# Patient Record
Sex: Male | Born: 1985 | Race: White | Hispanic: No | Marital: Single
Health system: Southern US, Community
[De-identification: ages and names within clinical notes are randomized; demographics above are authoritative.]

---

## 2012-05-29 ENCOUNTER — Ambulatory Visit (INDEPENDENT_AMBULATORY_CARE_PROVIDER_SITE_OTHER): Payer: PRIVATE HEALTH INSURANCE

## 2012-05-29 ENCOUNTER — Other Ambulatory Visit: Payer: Self-pay | Admitting: Unknown Physician Specialty

## 2012-05-29 DIAGNOSIS — R109 Unspecified abdominal pain: Secondary | ICD-10-CM

## 2012-05-29 DIAGNOSIS — R197 Diarrhea, unspecified: Secondary | ICD-10-CM

## 2012-07-01 ENCOUNTER — Ambulatory Visit (HOSPITAL_COMMUNITY): Payer: PRIVATE HEALTH INSURANCE | Admitting: Psychiatry

## 2013-07-02 ENCOUNTER — Telehealth: Payer: Self-pay | Admitting: *Deleted

## 2013-07-03 NOTE — Telephone Encounter (Signed)
Returned [employer] to me: 1. He and Mother Nathan Dougherty would like to make an appt with Dr. Fransico Michael and myself to discuss several issues. 2. They are having a lot of trouble getting Nathan Dougherty to eat. 3. Nathan Dougherty is losing weight and they are concerned. 4. Father is in a meeting and will call me back.

## 2013-07-31 ENCOUNTER — Institutional Professional Consult (permissible substitution): Payer: PRIVATE HEALTH INSURANCE | Admitting: Pediatrics

## 2014-01-12 IMAGING — CR DG ABDOMEN 2V
2 series · 2 of 2 positions shown · non-contrast
Comparison: None.

CLINICAL DATA: Left-sided back and abdomen pain, diarrhea for 5
days, recent history of kidney stone

ABDOMEN - 2 VIEW

[view not recorded (1 of 2)]
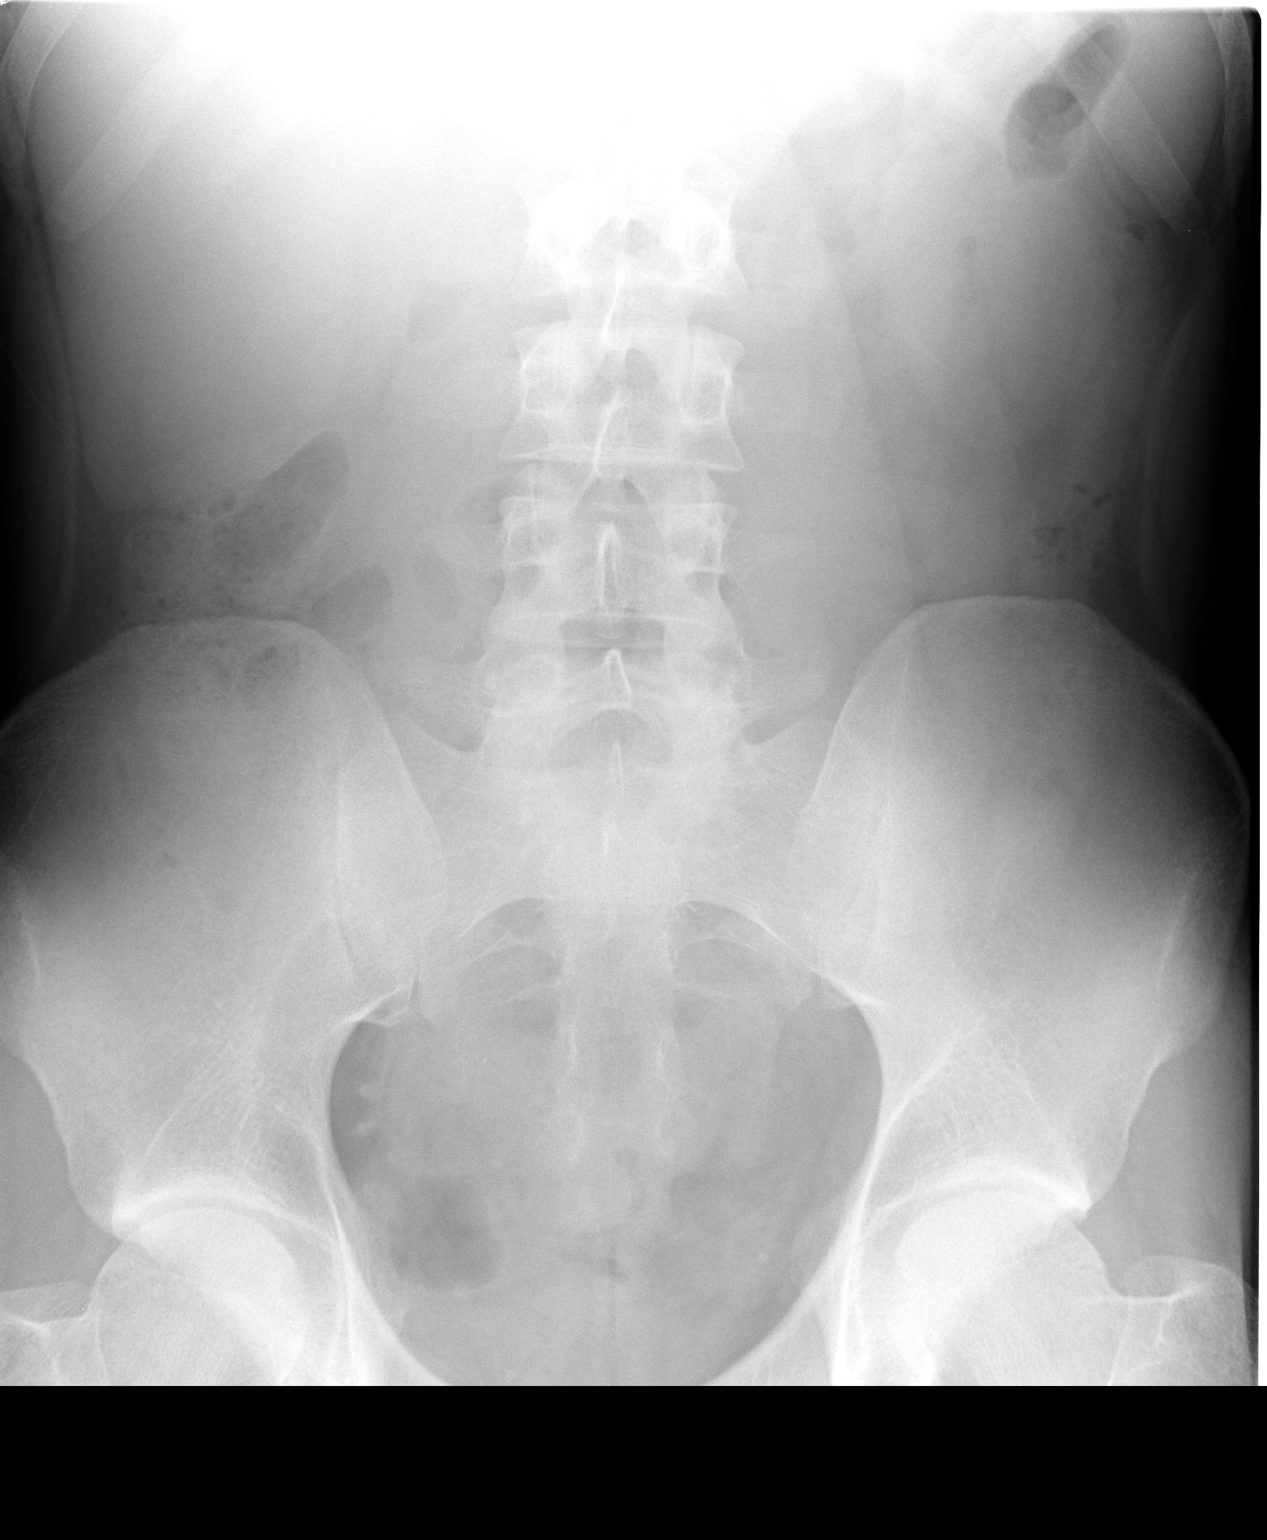

[view not recorded (2 of 2)]
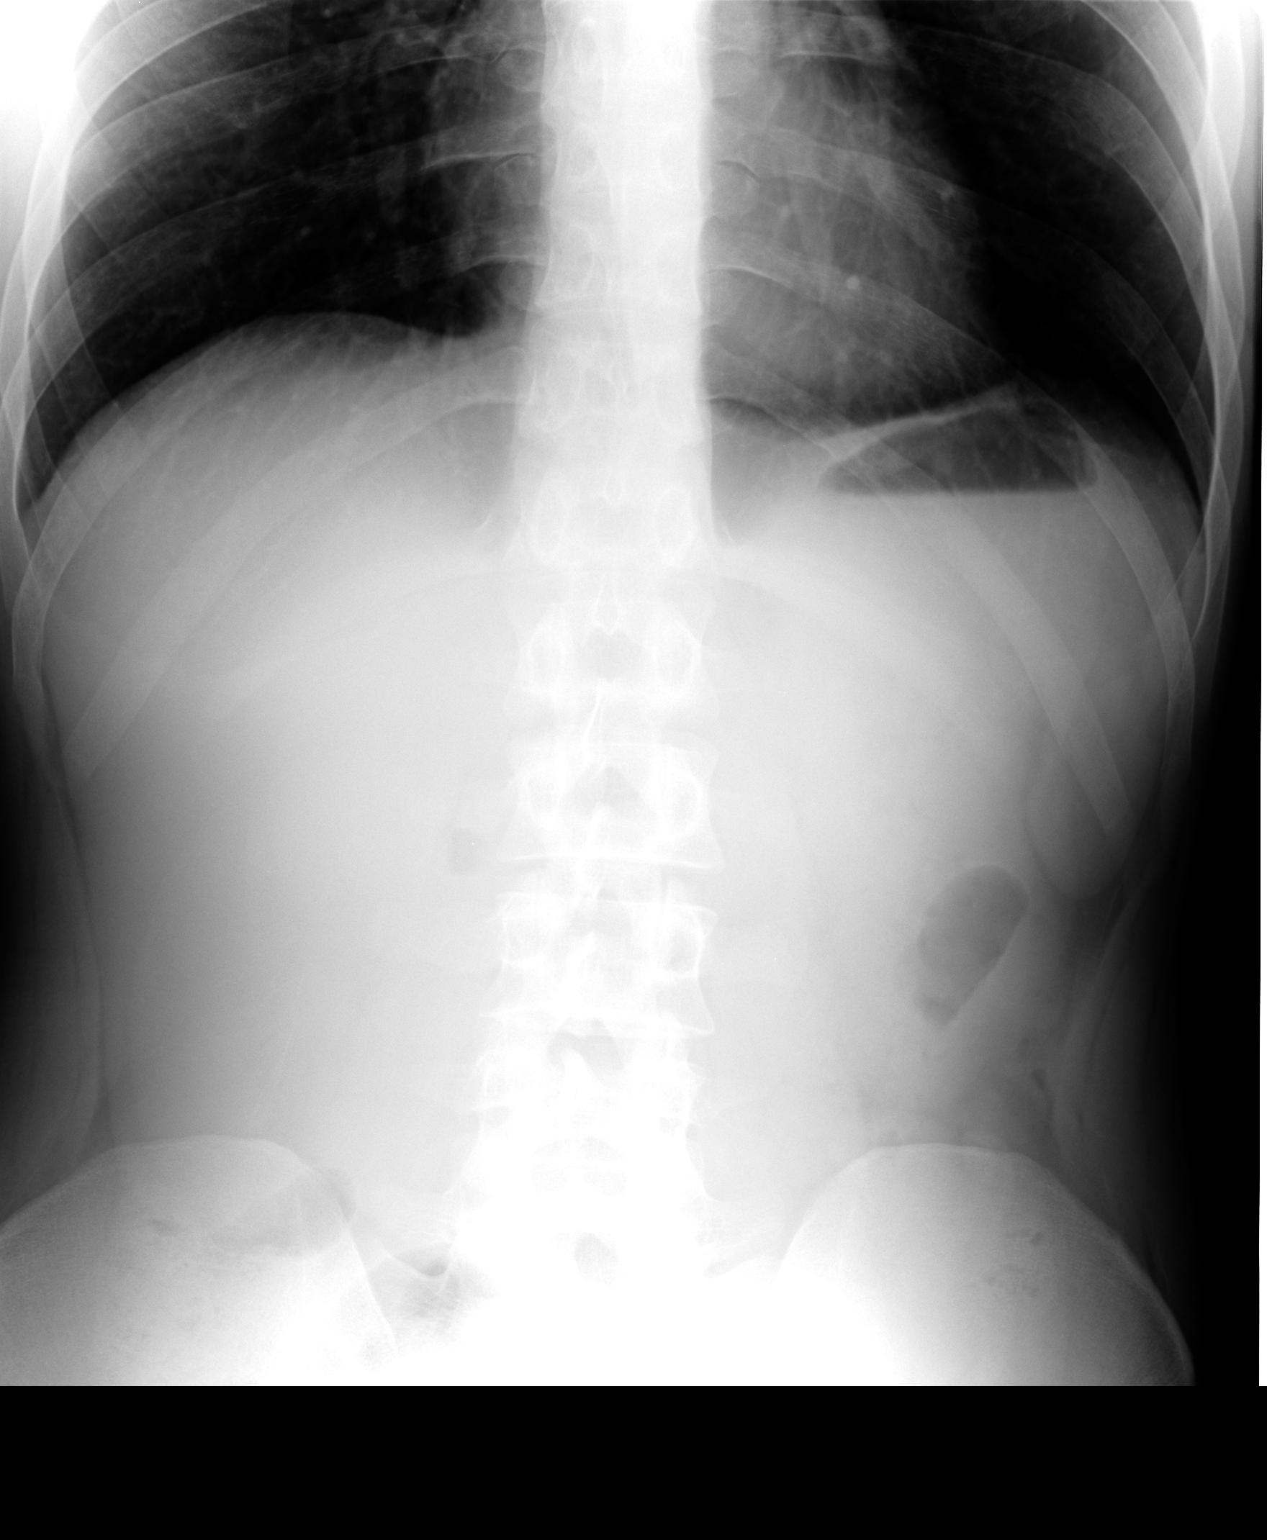

[2 of 2 positions shown; findings below may reference images not displayed]

FINDINGS: Supine and erect views of the abdomen show no bowel
obstruction.  No free air is seen.  No renal calculi are noted.
There are calcifications in the bony pelvis most typical of
phleboliths.  No bony abnormality is seen.
IMPRESSION: No bowel obstruction.  No free air.  No definite calculi.

## 2018-07-09 ENCOUNTER — Telehealth (HOSPITAL_COMMUNITY): Payer: Self-pay | Admitting: Psychology

## 2020-06-01 ENCOUNTER — Telehealth: Payer: Self-pay | Admitting: "Endocrinology

## 2020-06-16 NOTE — Telephone Encounter (Signed)
Opened in error

## 2024-06-19 ENCOUNTER — Ambulatory Visit: Payer: PRIVATE HEALTH INSURANCE | Admitting: Urgent Care
